# Patient Record
Sex: Female | Born: 1935 | Race: White | Hispanic: No | Smoking: Former smoker
Health system: Southern US, Community
[De-identification: ages and names within clinical notes are randomized; demographics above are authoritative.]

## PROBLEM LIST (undated history)

## (undated) DIAGNOSIS — K219 Gastro-esophageal reflux disease without esophagitis: Secondary | ICD-10-CM

## (undated) DIAGNOSIS — E7800 Pure hypercholesterolemia, unspecified: Secondary | ICD-10-CM

## (undated) HISTORY — PX: TONSILLECTOMY: SUR1361

## (undated) HISTORY — PX: VAGINAL HYSTERECTOMY: SUR661

---

## 1999-01-03 ENCOUNTER — Ambulatory Visit (HOSPITAL_COMMUNITY): Admission: RE | Admit: 1999-01-03 | Discharge: 1999-01-03 | Payer: Self-pay | Admitting: Specialist

## 1999-02-12 ENCOUNTER — Ambulatory Visit (HOSPITAL_COMMUNITY): Admission: RE | Admit: 1999-02-12 | Discharge: 1999-02-12 | Payer: Self-pay | Admitting: Specialist

## 1999-05-01 ENCOUNTER — Ambulatory Visit (HOSPITAL_COMMUNITY): Admission: RE | Admit: 1999-05-01 | Discharge: 1999-05-01 | Payer: Self-pay | Admitting: Gastroenterology

## 1999-06-20 ENCOUNTER — Encounter: Payer: Self-pay | Admitting: Family Medicine

## 1999-06-20 ENCOUNTER — Encounter: Admission: RE | Admit: 1999-06-20 | Discharge: 1999-06-20 | Payer: Self-pay | Admitting: Family Medicine

## 2000-06-20 ENCOUNTER — Encounter: Admission: RE | Admit: 2000-06-20 | Discharge: 2000-06-20 | Payer: Self-pay | Admitting: Internal Medicine

## 2000-06-20 ENCOUNTER — Encounter: Payer: Self-pay | Admitting: Family Medicine

## 2001-02-10 ENCOUNTER — Inpatient Hospital Stay (HOSPITAL_COMMUNITY): Admission: RE | Admit: 2001-02-10 | Discharge: 2001-02-12 | Payer: Self-pay | Admitting: Obstetrics and Gynecology

## 2001-02-10 ENCOUNTER — Encounter (INDEPENDENT_AMBULATORY_CARE_PROVIDER_SITE_OTHER): Payer: Self-pay

## 2001-06-24 ENCOUNTER — Encounter: Admission: RE | Admit: 2001-06-24 | Discharge: 2001-06-24 | Payer: Self-pay | Admitting: Cardiology

## 2001-06-24 ENCOUNTER — Encounter: Payer: Self-pay | Admitting: Family Medicine

## 2002-05-17 ENCOUNTER — Encounter (INDEPENDENT_AMBULATORY_CARE_PROVIDER_SITE_OTHER): Payer: Self-pay | Admitting: *Deleted

## 2002-05-17 ENCOUNTER — Ambulatory Visit (HOSPITAL_COMMUNITY): Admission: RE | Admit: 2002-05-17 | Discharge: 2002-05-17 | Payer: Self-pay | Admitting: Gastroenterology

## 2002-06-25 ENCOUNTER — Encounter: Payer: Self-pay | Admitting: Family Medicine

## 2002-06-25 ENCOUNTER — Encounter: Admission: RE | Admit: 2002-06-25 | Discharge: 2002-06-25 | Payer: Self-pay | Admitting: Family Medicine

## 2003-07-18 ENCOUNTER — Encounter: Admission: RE | Admit: 2003-07-18 | Discharge: 2003-07-18 | Payer: Self-pay | Admitting: Family Medicine

## 2004-08-07 ENCOUNTER — Encounter: Admission: RE | Admit: 2004-08-07 | Discharge: 2004-11-05 | Payer: Self-pay | Admitting: Family Medicine

## 2004-08-15 ENCOUNTER — Encounter: Admission: RE | Admit: 2004-08-15 | Discharge: 2004-08-15 | Payer: Self-pay | Admitting: Family Medicine

## 2004-08-27 ENCOUNTER — Encounter: Admission: RE | Admit: 2004-08-27 | Discharge: 2004-08-27 | Payer: Self-pay | Admitting: Nephrology

## 2005-08-28 ENCOUNTER — Encounter: Admission: RE | Admit: 2005-08-28 | Discharge: 2005-08-28 | Payer: Self-pay | Admitting: Family Medicine

## 2006-08-29 ENCOUNTER — Encounter: Admission: RE | Admit: 2006-08-29 | Discharge: 2006-08-29 | Payer: Self-pay | Admitting: Family Medicine

## 2006-10-24 ENCOUNTER — Encounter: Admission: RE | Admit: 2006-10-24 | Discharge: 2006-10-24 | Payer: Self-pay | Admitting: Nephrology

## 2007-08-31 ENCOUNTER — Encounter: Admission: RE | Admit: 2007-08-31 | Discharge: 2007-08-31 | Payer: Self-pay | Admitting: Family Medicine

## 2008-05-25 ENCOUNTER — Ambulatory Visit (HOSPITAL_COMMUNITY): Admission: RE | Admit: 2008-05-25 | Discharge: 2008-05-25 | Payer: Self-pay | Admitting: Family Medicine

## 2008-09-16 ENCOUNTER — Encounter: Admission: RE | Admit: 2008-09-16 | Discharge: 2008-09-16 | Payer: Self-pay | Admitting: Family Medicine

## 2009-07-20 ENCOUNTER — Ambulatory Visit (HOSPITAL_COMMUNITY): Admission: RE | Admit: 2009-07-20 | Discharge: 2009-07-20 | Payer: Self-pay | Admitting: Family Medicine

## 2009-12-21 ENCOUNTER — Encounter: Admission: RE | Admit: 2009-12-21 | Discharge: 2009-12-21 | Payer: Self-pay | Admitting: Family Medicine

## 2010-08-05 ENCOUNTER — Encounter: Payer: Self-pay | Admitting: Family Medicine

## 2010-11-19 ENCOUNTER — Other Ambulatory Visit: Payer: Self-pay | Admitting: Family Medicine

## 2010-11-19 DIAGNOSIS — Z1231 Encounter for screening mammogram for malignant neoplasm of breast: Secondary | ICD-10-CM

## 2010-11-30 NOTE — Discharge Summary (Signed)
[facility]  Patient:    Maria King, Maria King                         MRN: [id number] Adm. Date:  [id number] Disc. Date: [id number] Attending:  Lendon Colonel                           Discharge Summary  ADMISSION DIAGNOSIS:  Genital prolapse.  DISCHARGE DIAGNOSIS:  Genital prolapse.  OPERATION PERFORMED:  Enterocele repair and posterior repair.  HISTORY OF PRESENT ILLNESS:  [name] is a 77 year old female with pelvic pressure and discomfort with a large posterior segment relaxation consisting of an enterocele and rectocele.  She was on Synthroid, appeared to be healthy for her stated age.  LABORATORY DATA:  Admission hemoglobin was 11.  Admission glucose was 106. Creatinine was 0.9.  HOSPITAL COURSE:  The patient was admitted to the hospital, underwent an uneventful repair of enterocele and posterior repair.  Her postoperative course was uncomplicated.  Her suprapubic was left to drain, and she was sent home with an indwelling suprapubic to clamp her catheter in a couple of days and see how voiding went.  She is to call for fever or bleeding.  She was placed on Cipro 500 mg q.d.  CONDITION ON DISCHARGE:  Improved. DD:  02/19/01 TD:  02/20/01 Job: [id number]

## 2010-11-30 NOTE — H&P (Signed)
[facility]  Patient:    Maria King, Maria King                           MRN: [id number] Adm. Date:  02/10/01 Attending:  Katherine Roan, M.D.                         History and Physical  CHIEF COMPLAINT:  Pelvic discomfort.  HISTORY OF PRESENT ILLNESS:  [name] is a 77 year old para 3 female who is on Premarin 0.625 mg.  Came to the office complaining of pelvic pain and pelvic discomfort.  Pelvic ultrasound was done and was normal.  No adnexal masses are noted.  She had been seen by Dr. Ewing Schlein.  She has also been seen by Dr. Wanda Plump who referred her here.  She was complaining at that time of a perineal bulge.  She had no incontinence.  Intravesicle pathology by Dr. Wanda Plump was normal.  Because of the cystocele and rectocele and genital prolapse, A&P and enterocele was indicated.  CURRENT MEDICATIONS:  Synthroid and Metamucil.  PAST MEDICAL HISTORY:  Vaginal hysterectomy and a hernia in the past.  She has had cataract repair and a ganglion on her left wrist.  ALLERGIES:  No known drug allergies.  REVIEW OF SYSTEMS:  HEENT:  She wears glasses, but has no decrease in visual or auditory acuity.  No dizziness.  HEART:  No history of hypertension or rheumatic fever.  No history of heart murmur.  LUNGS:  No chronic cough, asthma, hay fever.  GENITOURINARY:  She denies stress incontinence, but does have a perineal bulge.  GASTROINTESTINAL:  She had a colonoscopy by Dr. Ewing Schlein about a year ago.  This was negative.  Pelvic ultrasound was negative.  She notes no anorexia or melena.  MUSCLES, BONES, AND JOINTS:  No fractures or arthritis.  FAMILY HISTORY:  Mother and father are both deceased.  Her mother died at age 42.  Her father died at 69.  She has three brothers who are deceased and two sisters who are deceased.  One sister is living and well.  She had a sister with breast cancer and a brother with prostate cancer.  Her son is diabetic.  PHYSICAL  EXAMINATION  GENERAL:  Well-developed, nourished female who appears to be her stated age, but is oriented to time, place, and recent events.  VITAL SIGNS:  Weight 148 pounds, blood pressure 120/80.  HEENT:  Unremarkable.  Oropharynx is not injected.  NECK:  Supple.  Thyroid is not enlarged.  Carotid pulses were equal without bruits.  No adenopathy appreciated.  LUNGS:  Clear to P&A.  BREASTS:  No masses or tenderness.  ABDOMEN:  Soft and flat.  Liver, spleen, kidneys are not palpated.  PELVIC:  Second and third degree cystocele with prolapse of the apex as well as a large rectocele.  RECTAL:  Hemoccult is negative.  EXTREMITIES:  Good range of motion.  Equal pulses and reflexes.  IMPRESSION:  Large cystocele and rectocele with pelvic discomfort.  PLAN:  A&P repair and possible enterocele repair.  Risks and benefits have been discussed with patient. DD:  02/09/01 TD:  02/09/01 Job: [id number]

## 2010-11-30 NOTE — Op Note (Signed)
NAME:  Maria King, Maria King                            ACCOUNT NO.:  [id number]   MEDICAL RECORD NO.:  [id number]                   PATIENT TYPE:  AMB   LOCATION:  ENDO                                 FACILITY:  [facility]   PHYSICIAN:  Petra Kuba, M.D.                 DATE OF BIRTH:  Jan 20, 1919   DATE OF PROCEDURE:  05/17/2002  DATE OF DISCHARGE:                                 OPERATIVE REPORT   PROCEDURE:  Esophagogastroduodenoscopy with biopsy.   INDICATIONS:  Long-standing upper tract symptoms, anemia, nondiagnostic  colonoscopy.   MEDICATIONS:  Additional Demerol 10 mg, Versed 1 mg, since this followed the  colonoscopy procedure.   DESCRIPTION OF PROCEDURE:  Consent was signed after risks, benefits,  methods, and options thoroughly discussed in the office.  The video  endoscope was inserted by direct vision.  The proximal and midesophagus was  normal.  In the distal esophagus was an obvious short segment of Barrett's,  which was extensively biopsied at the end of the procedure.  The scope  passed through a moderate-sized hiatal hernia into a normal stomach, into a  normal antrum, normal pylorus, into a normal duodenal bulb, and around the C-  loop to a normal second portion of the duodenum.  The scope was withdrawn  back to the bulb, and a good look there ruled out ulcers in that location.  The scope was withdrawn back to the stomach and retroflexed.  Angularis,  lesser and greater curve, fundus were normal.  In the cardia, the hiatal  hernia was confirmed.  The scope was straightened, and straight  visualization of the stomach was normal, air was suctioned, the scope slowly  withdrawn back to 18 cm.  No additional findings were seen.  The biopsies of  the Barrett's were obtained at this time.  The scope was then slowly  withdrawn again and ruling out any  other additional esophageal  abnormalities.  The scope was removed.  The patient tolerated the procedure  well.  There was no  obvious immediate complication.   ENDOSCOPIC DIAGNOSES:  1. Moderate hiatal hernia.  2. Short-segment Barrett's, status post biopsy.  3. Otherwise within normal limits EGD.   PLAN:  Await pathology to determine future endoscopic screening, although  based on her age, questionable the efficacy of that, continue pump  inhibitors for now.  Follow up p.r.n. or in two months.  Recheck guaiac,  CBCs, and make sure no further workup plans are needed.                                               Petra Kuba, M.D.    MEM/MEDQ  D:  05/17/2002  T:  05/17/2002  Job:  [id number]   cc:   Caryn Bee  Marylin Crosby, M.D.  [address]  [place]  [place] [address]  Fax: [phone]

## 2010-11-30 NOTE — Op Note (Signed)
[facility]  Patient:    Maria King, Maria King                         MRN: [id number] Proc. Date: 02/10/01 Adm. Date:  [id number] Attending:  Lendon Colonel                           Operative Report  PREOPERATIVE DIAGNOSES:  Pelvic relaxation with cystocele, rectocele, and enterocele.  POSTOPERATIVE DIAGNOSES:  Pelvic relaxation with cystocele, rectocele, and enterocele.  OPERATION:  A&P repair and repair of enterocele.  DESCRIPTION OF PROCEDURE:  The patient was placed in lithotomy position and prepped and draped in the usual fashion.  Apex of the vagina were grasped, and there was a large enterocele sac which was entered.  The enterocele sac was then closed with a pursestring suture of 2-0 Ethibond.  The uterosacral ligaments were palpated.  The ureters were noted to be superior to this, and then they were closed with 0 Ethibond.  The anterior repair was then effected by dissecting the pubocervical vaginal fascia from the vagina and then plicating this in the midline with interrupted sutures of 3-0 and 4-0 Vicryl. Following this, the vagina was closed with a locking suture of 4-0 PDS. Posteriorly, then we wedged into the perineal body and dissected the vagina from the underlying prerectal fascia and closed this with interrupted sutures of 3-0 Vicryl and 4-0 Vicryl.  The vaginal skin posteriorly was then closed with a locking suture of 4-0 PDS.  The bulbocavernosus muscles were then plicated in the midline with interrupted sutures of 3-0 Vicryl.  Following this, the perineal skin was closed with a subcuticular 4-0 PDS.  The vagina was irrigated; the pack was not inserted.  Suprapubic catheter was inserted without difficulty.  [name] tolerated the procedure well and was sent to the recovery room in good condition. DD:  02/10/01 TD:  02/10/01 Job: [id number]

## 2010-11-30 NOTE — Op Note (Signed)
NAME:  Maria King, Maria King                            ACCOUNT NO.:  [id number]   MEDICAL RECORD NO.:  [id number]                   PATIENT TYPE:  AMB   LOCATION:  ENDO                                 FACILITY:  [facility]   PHYSICIAN:  Petra Kuba, M.D.                 DATE OF BIRTH:  March 14, 1919   DATE OF PROCEDURE:  05/17/2002  DATE OF DISCHARGE:                                 OPERATIVE REPORT   PROCEDURE PERFORMED:  Colonoscopy with polypectomy.   ENDOSCOPIST:  Petra Kuba, M.D.   INDICATIONS FOR PROCEDURE:  Patient with history of colon polyps, recurrent  anemia, due for repeat screening.  Consent was signed after the risks,  benefits, methods and options were thoroughly discussed in the office.   MEDICINES USED:  Demerol 40 mg, Versed 4 mg.   DESCRIPTION OF PROCEDURE:  Rectal inspection was pertinent for external  hemorrhoids, small.  Digital exam was negative. Video pediatric adjustable  colonoscope was inserted and easily advanced around the colon to the cecum.  This did not require any abdominal pressure or position changes, no obvious  abnormality seen on insertion.  The cecum was identified by the appendiceal  orifice and the ileocecal valve.  In fact the scope was inserted a short  ways in the terminal ileum which was normal.  Photodocumentation was  obtained.  The scope was slowly withdrawn.  In the cecal pole, a sessile  polyp was seen.  Multiple hot biopsies at a setting of 15/15 were done  and  put in the first container.  The scope was further withdrawn.  In the  ascending and hepatic flexure, three tiny to small polyps were seen and were  all hot biopsied times one and put in a second container.  The scope was  then further withdrawn.  No additional findings were seen as we slowly  withdrew back to the rectum.  Once back in the rectum, the scope was  retroflexed, pertinent for some internal hemorrhoids.  The scope was  straightened and readvanced a short ways up the  left side of the colon.  Air  was suctioned, scope removed.  The prep was adequate.  There was some liquid  stool that required washing and suctioning.  The scope was removed.  The  patient tolerated the procedure well.  There were no obvious immediate  complication.   ENDOSCOPIC DIAGNOSIS:  1. Internal and external hemorrhoids.  2. Cecum with small sessile polyp hot biopsied on a setting of 15/15.  3. Few ascending and hepatic flexure tiny to small polyps hot biopsied.  4. Otherwise within normal limits to the terminal ileum.    PLAN:  Await pathology to determine future colonic screening.  Continue work-  up with an EGD.  Please see that dictation for further work-up and plans.  Petra Kuba, M.D.    MEM/MEDQ  D:  05/17/2002  T:  05/17/2002  Job:  [id number]   cc:   Caryn Bee L. Little, M.D.  [address]  [place]  [place] [address]  Fax: [phone]

## 2010-12-27 ENCOUNTER — Ambulatory Visit
Admission: RE | Admit: 2010-12-27 | Discharge: 2010-12-27 | Disposition: A | Discharge status: Home / Self Care | Payer: BC Managed Care – PPO | Source: Ambulatory Visit | Attending: Family Medicine | Admitting: Family Medicine

## 2010-12-27 DIAGNOSIS — Z1231 Encounter for screening mammogram for malignant neoplasm of breast: Secondary | ICD-10-CM

## 2011-02-21 ENCOUNTER — Other Ambulatory Visit: Payer: Self-pay | Admitting: Family Medicine

## 2011-02-21 DIAGNOSIS — R531 Weakness: Secondary | ICD-10-CM

## 2011-02-22 ENCOUNTER — Ambulatory Visit
Admission: RE | Admit: 2011-02-22 | Discharge: 2011-02-22 | Disposition: A | Discharge status: Home / Self Care | Payer: Medicare Other | Source: Ambulatory Visit | Attending: Family Medicine | Admitting: Family Medicine

## 2011-02-22 DIAGNOSIS — R531 Weakness: Secondary | ICD-10-CM

## 2011-02-22 MED ORDER — IOHEXOL 300 MG/ML  SOLN: 75.0000 mL | Freq: Once | INTRAMUSCULAR | Status: AC | PRN

## 2011-02-28 ENCOUNTER — Other Ambulatory Visit: Payer: Self-pay | Admitting: Gastroenterology

## 2011-03-11 ENCOUNTER — Other Ambulatory Visit: Payer: Self-pay | Admitting: Family Medicine

## 2011-03-11 DIAGNOSIS — M542 Cervicalgia: Secondary | ICD-10-CM

## 2011-03-13 ENCOUNTER — Ambulatory Visit
Admission: RE | Admit: 2011-03-13 | Discharge: 2011-03-13 | Disposition: A | Discharge status: Home / Self Care | Payer: Medicare Other | Source: Ambulatory Visit | Attending: Family Medicine | Admitting: Family Medicine

## 2011-03-13 DIAGNOSIS — M542 Cervicalgia: Secondary | ICD-10-CM

## 2011-04-16 LAB — MAGNESIUM: Magnesium: 1.8

## 2011-04-16 LAB — CREATININE, SERUM
Creatinine, Ser: 1.15
GFR calc Af Amer: 54 — ABNORMAL LOW

## 2011-04-16 LAB — PHOSPHORUS: Phosphorus: 2.4

## 2011-04-16 LAB — CALCIUM: Calcium: 8.6

## 2012-01-28 ENCOUNTER — Other Ambulatory Visit: Payer: Self-pay | Admitting: Family Medicine

## 2012-01-28 DIAGNOSIS — Z1231 Encounter for screening mammogram for malignant neoplasm of breast: Secondary | ICD-10-CM

## 2012-01-31 ENCOUNTER — Ambulatory Visit
Admission: RE | Admit: 2012-01-31 | Discharge: 2012-01-31 | Disposition: A | Discharge status: Home / Self Care | Payer: Medicare Other | Source: Ambulatory Visit | Attending: Family Medicine | Admitting: Family Medicine

## 2012-01-31 DIAGNOSIS — Z1231 Encounter for screening mammogram for malignant neoplasm of breast: Secondary | ICD-10-CM

## 2012-10-28 ENCOUNTER — Ambulatory Visit (HOSPITAL_COMMUNITY)
Admission: RE | Admit: 2012-10-28 | Discharge: 2012-10-28 | Disposition: A | Discharge status: Home / Self Care | Payer: Medicare Other | Source: Ambulatory Visit | Attending: Family Medicine | Admitting: Family Medicine

## 2012-10-28 ENCOUNTER — Encounter (HOSPITAL_COMMUNITY): Payer: Self-pay

## 2012-10-28 DIAGNOSIS — M810 Age-related osteoporosis without current pathological fracture: Secondary | ICD-10-CM | POA: Insufficient documentation

## 2012-10-28 HISTORY — DX: Gastro-esophageal reflux disease without esophagitis: K21.9

## 2012-10-28 HISTORY — DX: Pure hypercholesterolemia, unspecified: E78.00

## 2012-10-28 MED ORDER — SODIUM CHLORIDE 0.9 % IV SOLN
INTRAVENOUS | Status: AC
  Administered 2012-10-28: 13:00:00 via INTRAVENOUS

## 2012-10-28 MED ORDER — ZOLEDRONIC ACID 5 MG/100ML IV SOLN
5.0000 mg | Freq: Once | INTRAVENOUS | Status: AC
  Administered 2012-10-28: 5 mg via INTRAVENOUS
  Filled 2012-10-28: qty 100

## 2013-03-23 ENCOUNTER — Other Ambulatory Visit: Payer: Self-pay

## 2013-03-23 ENCOUNTER — Other Ambulatory Visit: Payer: Self-pay | Admitting: Gastroenterology

## 2013-03-23 DIAGNOSIS — Z1231 Encounter for screening mammogram for malignant neoplasm of breast: Secondary | ICD-10-CM

## 2013-04-15 ENCOUNTER — Ambulatory Visit: Payer: Medicare Other

## 2013-05-04 ENCOUNTER — Ambulatory Visit
Admission: RE | Admit: 2013-05-04 | Discharge: 2013-05-04 | Disposition: A | Discharge status: Home / Self Care | Payer: Medicare Other | Source: Ambulatory Visit

## 2013-05-04 DIAGNOSIS — Z1231 Encounter for screening mammogram for malignant neoplasm of breast: Secondary | ICD-10-CM

## 2015-03-08 ENCOUNTER — Encounter (INDEPENDENT_AMBULATORY_CARE_PROVIDER_SITE_OTHER): Payer: Self-pay | Admitting: Ophthalmology

## 2015-03-08 ENCOUNTER — Encounter (INDEPENDENT_AMBULATORY_CARE_PROVIDER_SITE_OTHER): Payer: Medicare Other | Admitting: Ophthalmology

## 2015-03-08 DIAGNOSIS — H43813 Vitreous degeneration, bilateral: Secondary | ICD-10-CM

## 2015-03-08 DIAGNOSIS — H3531 Nonexudative age-related macular degeneration: Secondary | ICD-10-CM

## 2015-03-08 DIAGNOSIS — H35033 Hypertensive retinopathy, bilateral: Secondary | ICD-10-CM | POA: Diagnosis not present

## 2015-03-08 DIAGNOSIS — I10 Essential (primary) hypertension: Secondary | ICD-10-CM | POA: Diagnosis not present

## 2017-01-15 ENCOUNTER — Emergency Department (HOSPITAL_COMMUNITY)
Admission: EM | Admit: 2017-01-15 | Discharge: 2017-01-15 | Disposition: A | Discharge status: Home / Self Care | Payer: Medicare Other | Attending: Emergency Medicine | Admitting: Emergency Medicine

## 2017-01-15 ENCOUNTER — Emergency Department (HOSPITAL_COMMUNITY): Payer: Medicare Other

## 2017-01-15 ENCOUNTER — Encounter (HOSPITAL_COMMUNITY): Payer: Self-pay

## 2017-01-15 DIAGNOSIS — Y939 Activity, unspecified: Secondary | ICD-10-CM | POA: Insufficient documentation

## 2017-01-15 DIAGNOSIS — Z7982 Long term (current) use of aspirin: Secondary | ICD-10-CM | POA: Insufficient documentation

## 2017-01-15 DIAGNOSIS — Y999 Unspecified external cause status: Secondary | ICD-10-CM | POA: Insufficient documentation

## 2017-01-15 DIAGNOSIS — Z87891 Personal history of nicotine dependence: Secondary | ICD-10-CM | POA: Diagnosis not present

## 2017-01-15 DIAGNOSIS — F0390 Unspecified dementia without behavioral disturbance: Secondary | ICD-10-CM | POA: Insufficient documentation

## 2017-01-15 DIAGNOSIS — S52501A Unspecified fracture of the lower end of right radius, initial encounter for closed fracture: Secondary | ICD-10-CM | POA: Insufficient documentation

## 2017-01-15 DIAGNOSIS — Y929 Unspecified place or not applicable: Secondary | ICD-10-CM | POA: Insufficient documentation

## 2017-01-15 DIAGNOSIS — Z79899 Other long term (current) drug therapy: Secondary | ICD-10-CM | POA: Diagnosis not present

## 2017-01-15 DIAGNOSIS — S6991XA Unspecified injury of right wrist, hand and finger(s), initial encounter: Secondary | ICD-10-CM | POA: Diagnosis present

## 2017-01-15 DIAGNOSIS — W1830XA Fall on same level, unspecified, initial encounter: Secondary | ICD-10-CM | POA: Insufficient documentation

## 2017-01-15 NOTE — Discharge Instructions (Signed)
Follow-up with your orthopedist as planned. ?

## 2017-01-15 NOTE — ED Notes (Signed)
Bed: WA03 Expected date:  Expected time:  Means of arrival:  Comments: 

## 2017-01-15 NOTE — ED Provider Notes (Signed)
[facility]-EMERGENCY DEPT Provider Note   CSN: [id number] Arrival date & time: 01/15/17  1102     History   Chief Complaint Chief Complaint  Patient presents with  . Arm Injury    HPI Maria King is a 83 y.o. female.  HPI The patient presents to the emergency room for evaluation of her right arm injury. Patient fell yesterday approximately 5 PM. She was seen by the orthopedic after-hours clinic. She had x-rays and was placed in a splint for a distal radius fracture. Patient has history of dementia. At some point during the night she removed her splint.  Family noticed that this morning they brought her and have her splint replaced. They also requested repeat x-ray because they were concerned that she may reinjured it Past Medical History:  Diagnosis Date  . GERD (gastroesophageal reflux disease)   . High cholesterol     There are no active problems to display for this patient.   Past Surgical History:  Procedure Laterality Date  . TONSILLECTOMY    . VAGINAL HYSTERECTOMY      OB History    No data available       Home Medications    Prior to Admission medications   Medication Sig Start Date End Date Taking? Authorizing Provider  aspirin 81 MG tablet Take 81 mg by mouth daily.    [provider]  Calcium Carbonate (CALTRATE 600 PO) Take 1,500 mg by mouth 2 (two) times daily.    [provider]  cholecalciferol (VITAMIN D) 1000 UNITS tablet Take 1,000 Units by mouth daily.    [provider]  ferrous sulfate 325 (65 FE) MG tablet Take 325 mg by mouth daily with breakfast.    [provider]  fish oil-omega-3 fatty acids 1000 MG capsule Take 1,000 mg by mouth daily.    [provider]  levothyroxine (SYNTHROID, LEVOTHROID) 88 MCG tablet Take 88 mcg by mouth daily before breakfast.    [provider]  LOSARTAN POTASSIUM PO Take 50 mg by mouth daily.    [provider]  omeprazole (PRILOSEC) 40 MG capsule Take 40  mg by mouth daily.    [provider]  rosuvastatin (CRESTOR) 20 MG tablet Take 20 mg by mouth daily.    [provider]    Family History History reviewed. No pertinent family history.  Social History Social History  Substance Use Topics  . Smoking status: Former Smoker    Quit date: 05/16/1979  . Smokeless tobacco: Not on file  . Alcohol use No     Allergies   Zantac [ranitidine hcl]   Review of Systems Review of Systems  All other systems reviewed and are negative.    Physical Exam Updated Vital Signs BP (!) 144/57 (BP Location: Left Arm)   Pulse (!) 58   Temp 98.1 F (36.7 C) (Oral)   Resp 18   Wt 58.5 kg (129 lb)   SpO2 97%   BMI 24.98 kg/m   Physical Exam  Constitutional: She appears well-developed and well-nourished. No distress.  HENT:  Head: Normocephalic and atraumatic.  Right Ear: External ear normal.  Left Ear: External ear normal.  Eyes: Conjunctivae are normal. Right eye exhibits no discharge. Left eye exhibits no discharge. No scleral icterus.  Neck: Neck supple. No tracheal deviation present.  Cardiovascular: Normal rate.   Pulmonary/Chest: Effort normal. No stridor. No respiratory distress.  Abdominal: She exhibits no distension.  Musculoskeletal: She exhibits edema and tenderness.  Right elbow: Normal.      Right wrist: She exhibits tenderness, bony tenderness and swelling.  Neurological: She is alert. Cranial nerve deficit: no gross deficits.  Skin: Skin is warm and dry. No rash noted.  Psychiatric: She has a normal mood and affect.  Nursing note and vitals reviewed.    ED Treatments / Results    Radiology Dg Wrist Complete Right  Result Date: 01/15/2017 CLINICAL DATA:  Fall.  Wrist pain EXAM: RIGHT WRIST - COMPLETE 3+ VIEW COMPARISON:  01/14/2017 FINDINGS: Distal right radial fracture is again noted. Decreasing posterior angulation of the distal fragments. Mild impaction. It Nondisplaced ulnar styloid fracture.  IMPRESSION: Decreasing angulation of the distal right radial fracture. Electronically Signed   By: Charlett Nose M.D.   On: 01/15/2017 12:06    Procedures Procedures (including critical care time)  Medications Ordered in ED Medications - No data to display   Initial Impression / Assessment and Plan / ED Course  I have reviewed the triage vital signs and the nursing notes.  Pertinent labs & imaging results that were available during my care of the patient were reviewed by me and considered in my medical decision making (see chart for details).   patient's x-rays show a persistent distal right radial fracture.  The radiologist was able to review x-rays from the outpatient visit. There is decreasing angulation of the distal right radial fracture. The patient will be resplinted. I discussed with John, orthopedic tech, he will reapply the splint and add some circumferential fiberglass up near her forearm to try to help prevent the patient from removing this again. She has plans to follow up with her orthopedic doctor as an outpatient.  Final Clinical Impressions(s) / ED Diagnoses   Final diagnoses:  Closed fracture of distal end of right radius, unspecified fracture morphology, initial encounter    New Prescriptions New Prescriptions   No medications on file     Linwood Dibbles, MD 01/15/17 1243

## 2017-01-15 NOTE — ED Notes (Signed)
Ortho tech contacted regarding Right [place] splint placement.

## 2017-01-15 NOTE — ED Triage Notes (Signed)
Patient BIB EMS from home for right arm injury. Patient fell yesterday at approx 1700. Patient injured right wrist and saw her orthopedist - patient had soft cast placed, and patient removed it herself. Patient right wrist is red and swollen at this time.

## 2017-09-09 ENCOUNTER — Other Ambulatory Visit: Payer: Self-pay | Admitting: Family Medicine

## 2017-09-09 DIAGNOSIS — R600 Localized edema: Secondary | ICD-10-CM

## 2017-09-10 ENCOUNTER — Other Ambulatory Visit: Payer: Self-pay

## 2017-12-31 ENCOUNTER — Non-Acute Institutional Stay: Payer: Medicare Other | Admitting: Hospice and Palliative Medicine

## 2017-12-31 DIAGNOSIS — Z515 Encounter for palliative care: Secondary | ICD-10-CM

## 2018-01-03 NOTE — Progress Notes (Signed)
    PALLIATIVE CARE CONSULT VISIT   PATIENT NAME: Maria King DOB: [birth year] MRN: [id number]  PRIMARY CARE PROVIDER: Patient, No Pcp Per  REFERRING PROVIDER: No referring provider defined for this encounter.  RESPONSIBLE PARTY:      RECOMMENDATIONS and PLAN:  1.Memory loss: secondary to dementia. She remains in the AL setting. FAST 7a approaching. Uncertain if incontinent of stool? She is able to get herself to the bathroom but needs assistance. Staff report that she might be going to [facility] nursing? I have not spoken with family about this yet. Will reach out.  2. ACP: DNR status. MOST form on chart but needs to be updated. Family may be sending resident to SNF setting?   I spent 15 minutes providing this consultation,  from 10 to 10:15am. More than 50% of the time in this consultation was spent interviewing staff, assessing patient and coordinating communication.   HISTORY OF PRESENT ILLNESS:  Maria King is a 84 y.o.  female with multiple medical problems who resides in the AL setting. Palliative Care was asked to help address symptom management and  goals of care.   CODE STATUS: DNR  PPS: 40 to weak 50% HOSPICE ELIGIBILITY/DIAGNOSIS: TBD  PAST MEDICAL HISTORY:  Past Medical History:  Diagnosis Date  . GERD (gastroesophageal reflux disease)   . High cholesterol     SOCIAL HX:  Social History   Tobacco Use  . Smoking status: Former Smoker    Last attempt to quit: 05/16/1979    Years since quitting: 38.6  Substance Use Topics  . Alcohol use: No    ALLERGIES:  Allergies  Allergen Reactions  . Zantac [Ranitidine Hcl] Other (See Comments)    Has Hiatal hernia and cannot tolerate zantac     PERTINENT MEDICATIONS:  Outpatient Encounter Medications as of 12/31/2017  Medication Sig  . aspirin 81 MG tablet Take 81 mg by mouth daily.  . Calcium Carbonate (CALTRATE 600 PO) Take 1,500 mg by mouth 2 (two) times daily.  . cholecalciferol (VITAMIN D) 1000 UNITS tablet Take  1,000 Units by mouth daily.  . ferrous sulfate 325 (65 FE) MG tablet Take 325 mg by mouth daily with breakfast.  . fish oil-omega-3 fatty acids 1000 MG capsule Take 1,000 mg by mouth daily.  Marland Kitchen. levothyroxine (SYNTHROID, LEVOTHROID) 88 MCG tablet Take 88 mcg by mouth daily before breakfast.  . LOSARTAN POTASSIUM PO Take 50 mg by mouth daily.  Marland Kitchen. omeprazole (PRILOSEC) 40 MG capsule Take 40 mg by mouth daily.  . rosuvastatin (CRESTOR) 20 MG tablet Take 20 mg by mouth daily.   No facility-administered encounter medications on file as of 12/31/2017.     PHYSICAL EXAM:   General: alert, elderly Caucasian female sitting in chair in room in NAD Cardiovascular: irreg rhythm; reg rate Pulmonary: CTAB Abdomen: soft, active BS, NTTP Extremities: weak upon standing/walking  Skin: thin, fragile Neurological:alert; memory worsening; oriented to self only   Truett PernaAnita Colleen Coady, NP

## 2019-01-14 IMAGING — CR DG WRIST COMPLETE 3+V*R*
4 series · 4 of 4 positions shown · non-contrast
Comparison: 01/14/2017

CLINICAL DATA: Fall.  Wrist pain

EXAM:
RIGHT WRIST - COMPLETE 3+ VIEW

[x wrist pa right]
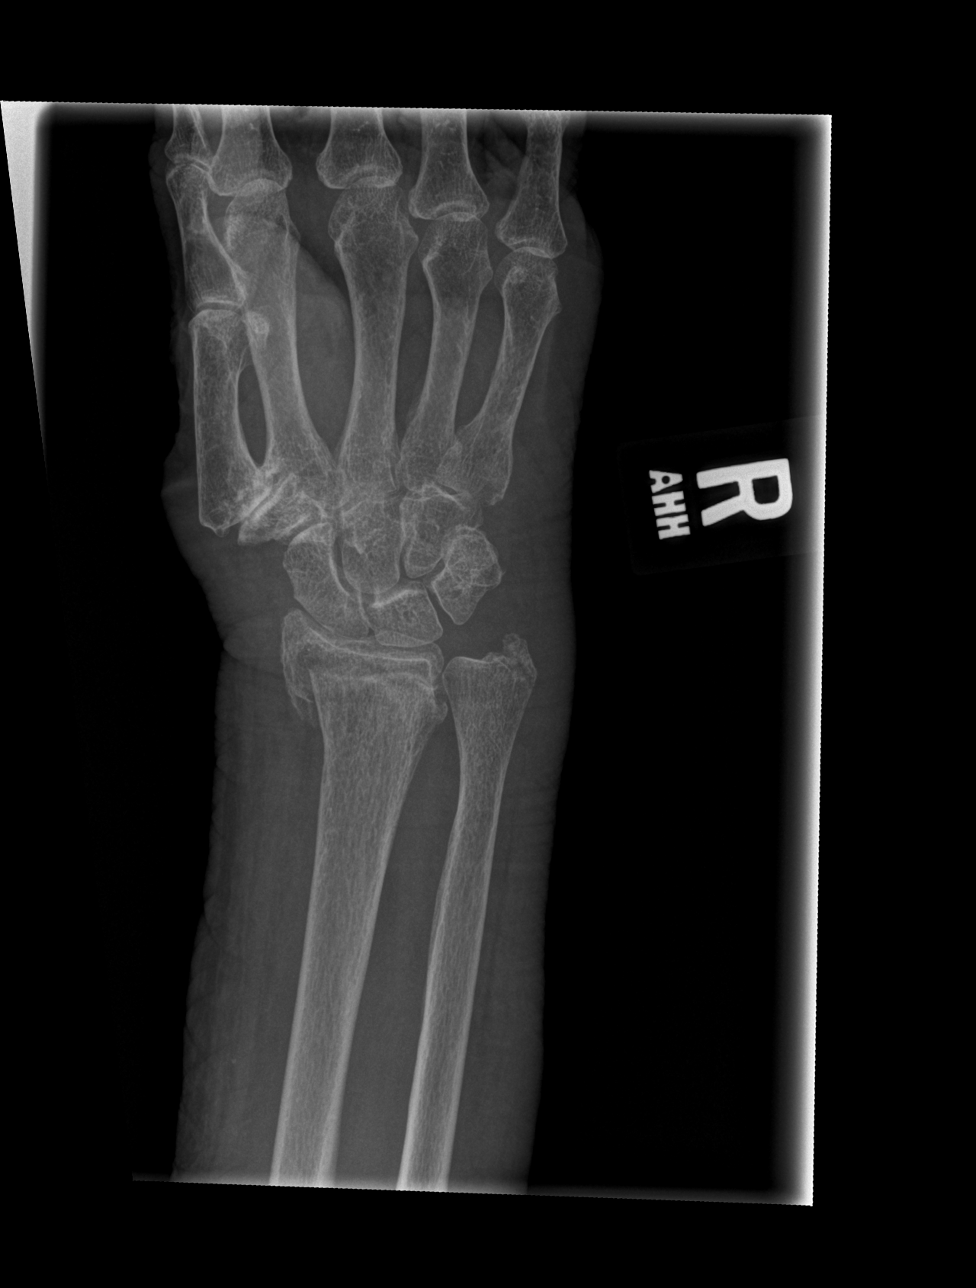

[x wrist obl right]
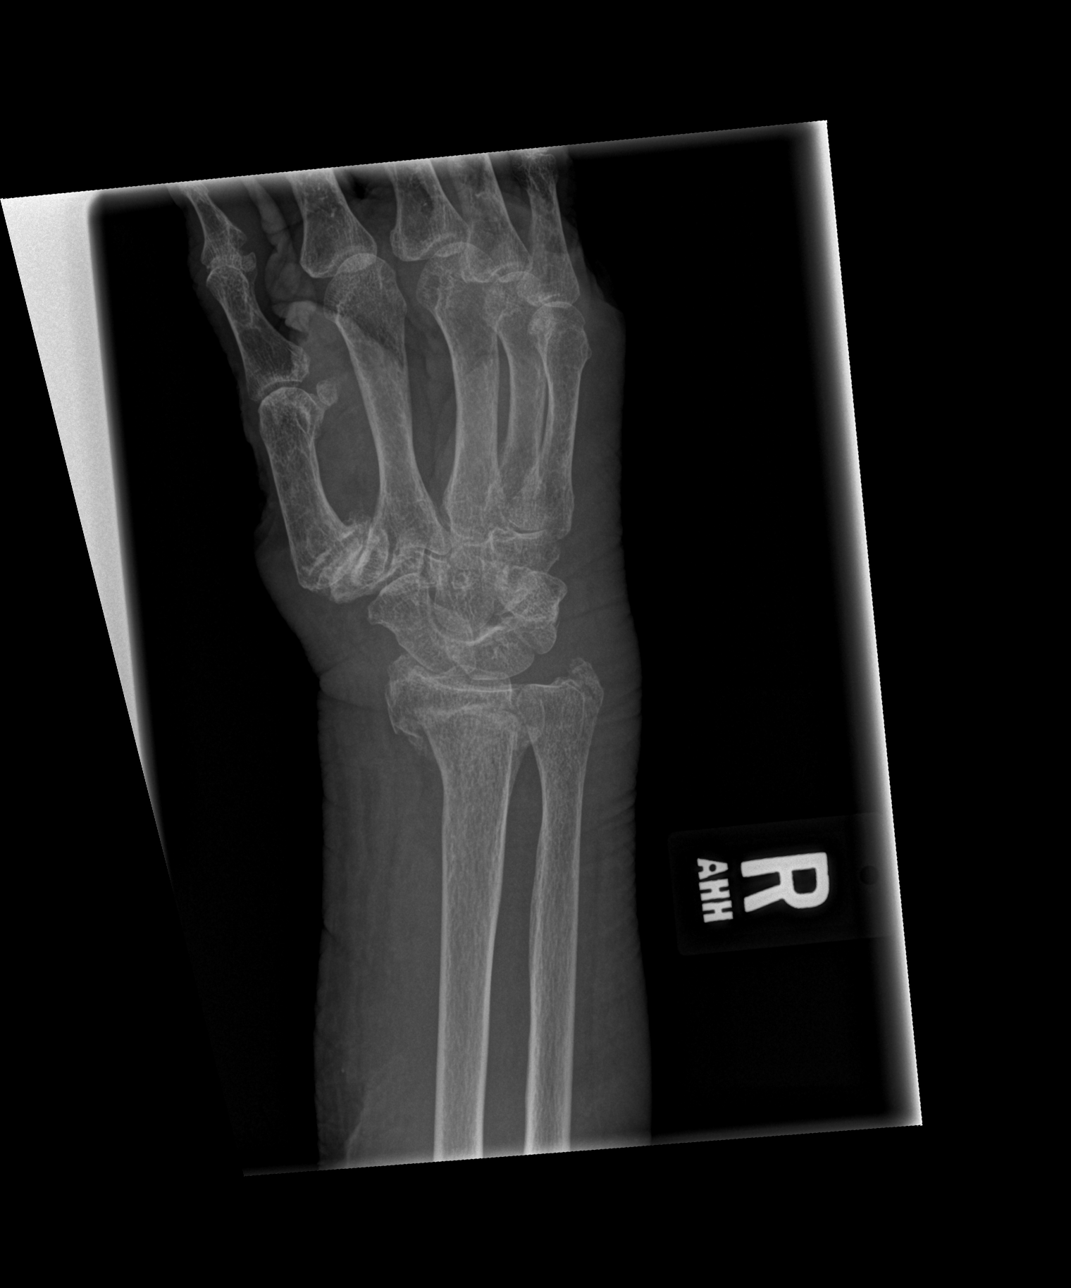

[x wrist lat right]
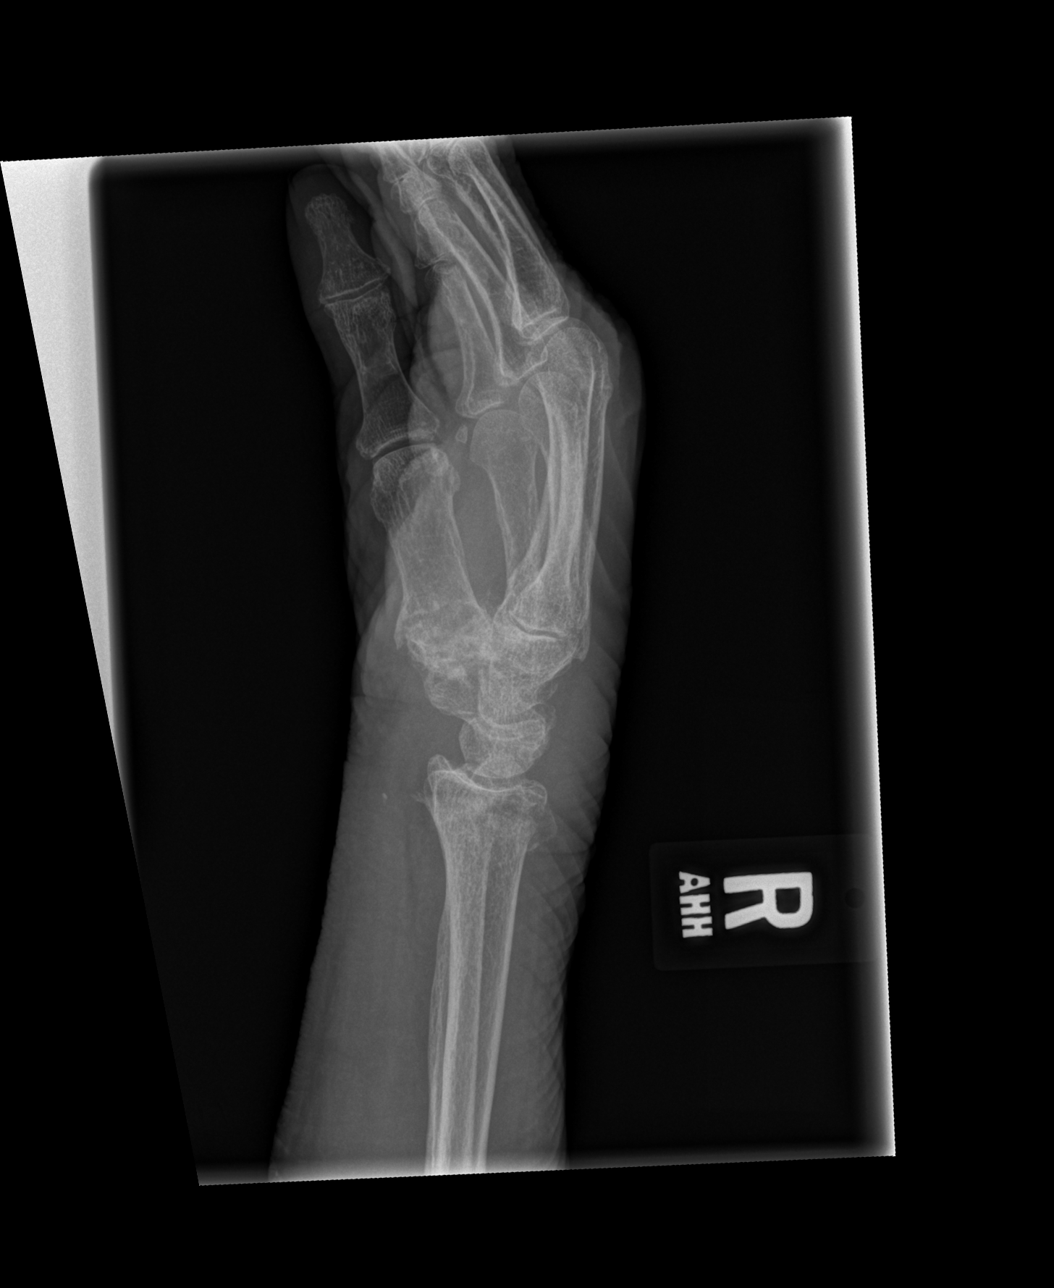

[x wrist navicular view right]
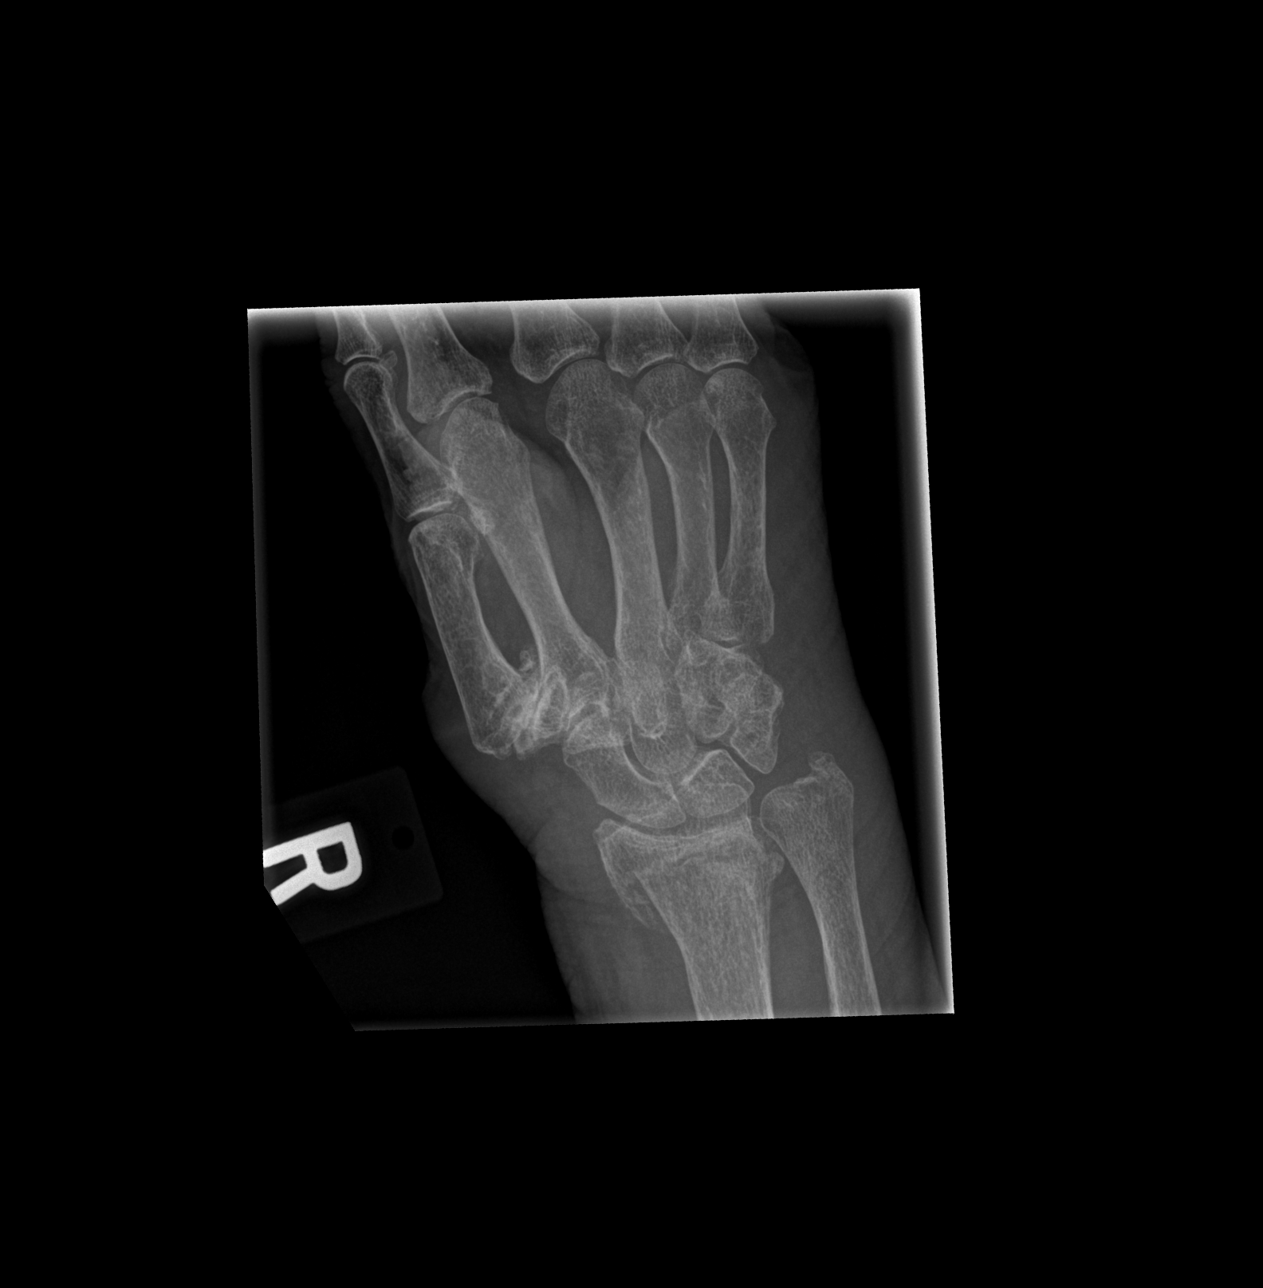

[4 of 4 positions shown; findings below may reference images not displayed]

FINDINGS: Distal right radial fracture is again noted. Decreasing posterior
angulation of the distal fragments. Mild impaction. It Nondisplaced
ulnar styloid fracture.
IMPRESSION: Decreasing angulation of the distal right radial fracture.

## 2019-02-13 DEATH — deceased
